# Patient Record
Sex: Female | Born: 1970 | Race: White | Hispanic: No | Marital: Married
Health system: Southern US, Community
[De-identification: ages and names within clinical notes are randomized; demographics above are authoritative.]

## PROBLEM LIST (undated history)

## (undated) ENCOUNTER — Emergency Department (HOSPITAL_COMMUNITY): Payer: 59 | Source: Home / Self Care

---

## 2000-12-15 ENCOUNTER — Other Ambulatory Visit: Admission: RE | Admit: 2000-12-15 | Discharge: 2000-12-15 | Payer: Self-pay | Admitting: Obstetrics & Gynecology

## 2001-04-30 ENCOUNTER — Inpatient Hospital Stay (HOSPITAL_COMMUNITY): Admission: AD | Admit: 2001-04-30 | Discharge: 2001-04-30 | Payer: Self-pay | Admitting: Obstetrics & Gynecology

## 2001-05-01 ENCOUNTER — Inpatient Hospital Stay (HOSPITAL_COMMUNITY): Admission: AD | Admit: 2001-05-01 | Discharge: 2001-05-01 | Payer: Self-pay | Admitting: Obstetrics and Gynecology

## 2001-05-02 ENCOUNTER — Encounter (INDEPENDENT_AMBULATORY_CARE_PROVIDER_SITE_OTHER): Payer: Self-pay

## 2001-05-02 ENCOUNTER — Inpatient Hospital Stay (HOSPITAL_COMMUNITY): Admission: AD | Admit: 2001-05-02 | Discharge: 2001-05-04 | Payer: Self-pay | Admitting: Obstetrics and Gynecology

## 2001-08-30 ENCOUNTER — Encounter (INDEPENDENT_AMBULATORY_CARE_PROVIDER_SITE_OTHER): Payer: Self-pay

## 2001-08-30 ENCOUNTER — Observation Stay (HOSPITAL_COMMUNITY): Admission: RE | Admit: 2001-08-30 | Discharge: 2001-08-31 | Payer: Self-pay | Admitting: Obstetrics & Gynecology

## 2002-04-19 ENCOUNTER — Other Ambulatory Visit: Admission: RE | Admit: 2002-04-19 | Discharge: 2002-04-19 | Payer: Self-pay | Admitting: Obstetrics & Gynecology

## 2004-01-30 ENCOUNTER — Other Ambulatory Visit: Admission: RE | Admit: 2004-01-30 | Discharge: 2004-01-30 | Payer: Self-pay | Admitting: Obstetrics & Gynecology

## 2005-07-23 ENCOUNTER — Ambulatory Visit: Payer: Self-pay | Admitting: Internal Medicine

## 2005-08-23 ENCOUNTER — Ambulatory Visit: Payer: Self-pay | Admitting: Internal Medicine

## 2005-09-02 ENCOUNTER — Ambulatory Visit: Payer: Self-pay | Admitting: Internal Medicine

## 2005-09-22 ENCOUNTER — Encounter: Admission: RE | Admit: 2005-09-22 | Discharge: 2005-09-22 | Payer: Self-pay

## 2005-10-11 ENCOUNTER — Encounter: Admission: RE | Admit: 2005-10-11 | Discharge: 2005-10-11 | Payer: Self-pay | Admitting: Orthopedic Surgery

## 2005-12-22 ENCOUNTER — Ambulatory Visit: Payer: Self-pay | Admitting: Internal Medicine

## 2006-09-04 ENCOUNTER — Encounter: Admission: RE | Admit: 2006-09-04 | Discharge: 2006-09-04 | Payer: Self-pay | Admitting: Family Medicine

## 2008-09-23 ENCOUNTER — Encounter: Payer: Self-pay | Admitting: Internal Medicine

## 2008-10-28 ENCOUNTER — Ambulatory Visit: Payer: Self-pay | Admitting: Internal Medicine

## 2008-10-28 DIAGNOSIS — M79609 Pain in unspecified limb: Secondary | ICD-10-CM | POA: Insufficient documentation

## 2008-10-28 DIAGNOSIS — E785 Hyperlipidemia, unspecified: Secondary | ICD-10-CM | POA: Insufficient documentation

## 2008-10-28 DIAGNOSIS — F3289 Other specified depressive episodes: Secondary | ICD-10-CM | POA: Insufficient documentation

## 2008-10-28 DIAGNOSIS — F329 Major depressive disorder, single episode, unspecified: Secondary | ICD-10-CM | POA: Insufficient documentation

## 2008-10-28 DIAGNOSIS — F411 Generalized anxiety disorder: Secondary | ICD-10-CM | POA: Insufficient documentation

## 2008-10-28 DIAGNOSIS — D649 Anemia, unspecified: Secondary | ICD-10-CM | POA: Insufficient documentation

## 2009-08-16 ENCOUNTER — Ambulatory Visit: Payer: Self-pay | Admitting: Diagnostic Radiology

## 2009-08-16 ENCOUNTER — Emergency Department (HOSPITAL_BASED_OUTPATIENT_CLINIC_OR_DEPARTMENT_OTHER): Admission: EM | Admit: 2009-08-16 | Discharge: 2009-08-16 | Payer: Self-pay | Admitting: Emergency Medicine

## 2009-09-24 ENCOUNTER — Encounter: Admission: RE | Admit: 2009-09-24 | Discharge: 2009-09-24 | Payer: Self-pay | Admitting: Sports Medicine

## 2010-04-29 ENCOUNTER — Ambulatory Visit: Payer: Self-pay | Admitting: Genetic Counselor
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
## 2010-05-05 NOTE — Letter (Signed)
Summary: Sprained ankle/[facility]  Sprained ankle/[facility]   Imported By: Lester  11/07/2008 08:24:14  _____________________________________________________________________  External Attachment:    Type:   Image     Comment:   External Document

## 2010-06-01 ENCOUNTER — Other Ambulatory Visit: Payer: Self-pay | Admitting: Dermatology

## 2010-06-15 ENCOUNTER — Encounter: Payer: Self-pay | Admitting: Internal Medicine

## 2010-06-15 ENCOUNTER — Encounter: Payer: 59 | Admitting: Genetic Counselor

## 2010-06-25 ENCOUNTER — Other Ambulatory Visit: Payer: Self-pay | Admitting: Dermatology

## 2010-07-02 NOTE — Letter (Signed)
Summary: [facility]  [facility]   Imported By: Sherian Rein 06/22/2010 12:32:25  _____________________________________________________________________  External Attachment:    Type:   Image     Comment:   External Document
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
## 2010-08-21 NOTE — Op Note (Signed)
[facility]  Patient:    Sylvia Phillips, Sylvia Phillips Visit Number: [id number] MRN: [id number]          Service Type: DSU Location: 910B 9198 01 Attending Physician:  Minette Headland Dictated by:   Freddy Finner, M.D. Proc. Date: 08/30/01 Admit Date:  08/30/2001                             Operative Report  PREOPERATIVE DIAGNOSIS:       Microinvasive carcinoma of the cervix.  POSTOPERATIVE DIAGNOSIS:      Microinvasive carcinoma of the cervix.  OPERATION/PROCEDURE:          Total vaginal hysterectomy.  SURGEON:                      Freddy Finner, M.D.  ASSISTANT:                    Trevor Iha, M.D.  ESTIMATED BLOOD LOSS:         Intraoperatively less than or equal to 50 cc.  COMPLICATIONS:                Intraoperative, none.  ANESTHESIA:                   General endotracheal.  PROCEDURE:                    The patient was admitted on the morning of surgery and given 1 g Cefotan IV preoperatively, placed in PAS hose, and taken to the operating room and there placed under adequate general endotracheal anesthesia and placed in the dorsal lithotomy position.  Betadine prep of the lower abdomen, perineum, and vagina was carried out in the usual fashion. Sterile drapes were applied.  A posterior weighted vaginal retractor was placed.  The cervix was grasped with a single-tooth tenaculum to avoid distorting the cervix and the transformation zone.  A posterior colpotomy incision was made while tenting the mucosa posterior to the cervix.  The cervix was circumscribed with a scalpel.  Initial attempts were made to use the LigaSure system but it was defective.  The uterosacrals were clamped with Heaneys and divided sharply and ligated with 0 Vicryl in Heaney fashion. Bladder pillars were taken separately and suture ligated with 0 Monocryl.  The anterior peritoneum was entered.  Cardinal ligament pedicles were taken and divided sharply and  ligated with suture ligatures of 0 Monocryl.  Vascular pedicles were taken curved Heaneys, divided sharply, and ligated with 0 Monocryl.  The uterus was delivered to the introitus.  Utero-ovarian pedicles were clamped with Heaneys and the uterus removed.  The cervix was labeled with a suture at the 12 oclock position and the specimen was taken to pathology in fresh condition.  The utero-ovarian pedicles were doubly ligated with three ties of 0 Monocryl followed by suture ligature of 0 Monocryl.  Hemostasis was complete at this level.  The uterosacrals were anchored to the vagina with mattress sutures of 0 Monocryl.  The uterosacrals were plicated with interrupted 0 Monocryl, which also closed the posterior peritoneum.  The cuff was closed vertically with figure-of-eights of 0 Monocryl.  A Foley catheter was inserted.  The patient was awakened and taken to the recovery room in good condition.Dictated by:   Freddy Finner, M.D. Attending Physician:  Minette Headland DD:  08/30/01 TD:  08/30/01 Job:  [id number]

## 2010-08-21 NOTE — Discharge Summary (Signed)
[facility]  Patient:    Sylvia Phillips, Sylvia Phillips Visit Number: [id number] MRN: [id number]          Service Type: DSU Location: 910A 9119 01 Attending Physician:  Minette Headland Dictated by:   Freddy Finner, M.D. 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  OPERATIVE PROCEDURE:             Total vaginal hysterectomy.  POSTOPERATIVE COMPLICATIONS:     Sinusitis; no other significant complications.  DISPOSITION:                     The patient is in satisfactory condition at the time of discharge.  She is to have progressive increasing physical activity with no vaginal entry, no heavy lifting.  She is to call for fever above 100 or heavy vaginal bleeding.  She is given the following medications.  DISCHARGE MEDICATIONS:           1. Ceftin 500 mg b.i.d. for a week.                                  2. Allegra-D b.i.d. for 3-7 days as needed.                                  3. Percocet 5 mg to be taken one or two every                                     4-6 hours as needed for postoperative                                     pain.                                  4. Phenergan suppositories 25 mg to be used                                     every 4-6 hours as needed for nausea and                                     vomiting.  DIET:                            Regular.  FOLLOWUP:  Follow up in 2 weeks.  HISTORY OF PRESENT ILLNESS:      Details of the present illness are recorded in the admission note.  The patient was admitted on the morning of surgery.  LABORATORY DATA:                 Laboratory data during this admission includes preoperative CBC which was normal except for a platelet count of 500,000; postoperative CBC showed a 7.8 white count,  10.1 hemoglobin, 423,000 platelets.  Admission prothrombin time/PTT and INR were essentially normal. The admission metabolic panel was normal.  HOSPITAL COURSE:                 The patient was admitted on the morning of surgery, brought to the operating room where the above-named surgery was accomplished.  Pathology reports pending at the time of discharge.  Over the 36 hours following surgery, the patient remained afebrile, she was ambulating without difficulty, having adequate bowel and bladder function.  She was complaining of sinus symptoms and had some emesis but she thinks this is related to sinus pressure and headache and has definitively requested discharge.  She is discharged home with thorough disposition as noted above. Dictated by:   Freddy Finner, M.D. Attending Physician:  Minette Headland DD:  08/31/01 TD:  09/02/01 Job: [id number]
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
## 2015-01-10 ENCOUNTER — Other Ambulatory Visit: Payer: Self-pay

## 2016-09-28 ENCOUNTER — Other Ambulatory Visit: Payer: Self-pay | Admitting: Family Medicine

## 2016-09-28 DIAGNOSIS — N63 Unspecified lump in unspecified breast: Secondary | ICD-10-CM

## 2016-10-01 ENCOUNTER — Other Ambulatory Visit: Payer: Self-pay

## 2016-10-07 ENCOUNTER — Other Ambulatory Visit: Payer: Self-pay

## 2016-10-13 ENCOUNTER — Other Ambulatory Visit: Payer: Self-pay

## 2016-10-18 ENCOUNTER — Other Ambulatory Visit: Payer: Self-pay

## 2020-08-08 ENCOUNTER — Inpatient Hospital Stay: Payer: 59

## 2020-08-08 ENCOUNTER — Other Ambulatory Visit: Payer: Self-pay

## 2020-08-08 ENCOUNTER — Inpatient Hospital Stay: Payer: 59 | Attending: Medical | Admitting: Genetic Counselor

## 2020-08-08 DIAGNOSIS — Z803 Family history of malignant neoplasm of breast: Secondary | ICD-10-CM | POA: Diagnosis not present

## 2020-08-08 DIAGNOSIS — Z8042 Family history of malignant neoplasm of prostate: Secondary | ICD-10-CM

## 2020-08-08 DIAGNOSIS — Z8041 Family history of malignant neoplasm of ovary: Secondary | ICD-10-CM

## 2020-08-08 DIAGNOSIS — Z8049 Family history of malignant neoplasm of other genital organs: Secondary | ICD-10-CM

## 2020-08-08 DIAGNOSIS — Z800 Family history of malignant neoplasm of digestive organs: Secondary | ICD-10-CM | POA: Diagnosis not present

## 2020-08-08 DIAGNOSIS — Z8481 Family history of carrier of genetic disease: Secondary | ICD-10-CM

## 2020-08-15 ENCOUNTER — Encounter: Payer: Self-pay | Admitting: Genetic Counselor

## 2020-08-15 DIAGNOSIS — Z8481 Family history of carrier of genetic disease: Secondary | ICD-10-CM | POA: Insufficient documentation

## 2020-08-15 DIAGNOSIS — Z8042 Family history of malignant neoplasm of prostate: Secondary | ICD-10-CM

## 2020-08-15 DIAGNOSIS — Z8041 Family history of malignant neoplasm of ovary: Secondary | ICD-10-CM

## 2020-08-15 DIAGNOSIS — Z800 Family history of malignant neoplasm of digestive organs: Secondary | ICD-10-CM

## 2020-08-15 DIAGNOSIS — Z803 Family history of malignant neoplasm of breast: Secondary | ICD-10-CM

## 2020-08-15 DIAGNOSIS — Z8049 Family history of malignant neoplasm of other genital organs: Secondary | ICD-10-CM | POA: Insufficient documentation

## 2020-08-15 HISTORY — DX: Family history of malignant neoplasm of breast: Z80.3

## 2020-08-15 HISTORY — DX: Family history of malignant neoplasm of prostate: Z80.42

## 2020-08-15 HISTORY — DX: Family history of malignant neoplasm of ovary: Z80.41

## 2020-08-15 HISTORY — DX: Family history of malignant neoplasm of digestive organs: Z80.0

## 2020-08-15 HISTORY — DX: Family history of malignant neoplasm of other genital organs: Z80.49

## 2020-08-15 NOTE — Progress Notes (Signed)
REFERRING PROVIDER: No referring provider defined for this encounter.  PRIMARY PROVIDER:  No PCP  PRIMARY REASON FOR VISIT:  1. Family history of breast cancer   2. Family history of colon cancer   3. Family history of ovarian cancer   4. Family history of pancreatic cancer   5. Family history of prostate cancer   6. Family history of uterine cancer   7. Family history of BAP1 mutation    HISTORY OF PRESENT ILLNESS:   Sylvia Phillips, a 50 y.o. female, was seen for a [place] cancer genetics consultation due to a family history of cancer and a family history of a hereditary cancer mutation.  Sylvia Phillips presents to clinic today to discuss the possibility of a hereditary predisposition to cancer, to discuss genetic testing, and to further clarify her future cancer risks, as well as potential cancer risks for family members.   Sylvia Phillips has a personal history of cervical cancer in her 54s.  CANCER HISTORY:  Oncology History   No history exists.   RISK FACTORS:  Menarche was at age 23.  First live birth at age 72.  Ovaries intact: yes.  HRT use: 0 years. Colonoscopy: no; not examined. Mammogram within the last year: no.  Number of breast biopsies: 0. Up to date with pelvic exams: no.   Past Medical History:  Diagnosis Date  . Family history of breast cancer 08/15/2020  . Family history of colon cancer 08/15/2020  . Family history of ovarian cancer 08/15/2020  . Family history of pancreatic cancer 08/15/2020  . Family history of prostate cancer 08/15/2020  . Family history of uterine cancer 08/15/2020    Social History   Socioeconomic History  . Marital status: Married    Spouse name: Not on file  . Number of children: Not on file  . Years of education: Not on file  . Highest education level: Not on file  Occupational History  . Not on file  Tobacco Use  . Smoking status: Not on file  . Smokeless tobacco: Not on file  Substance and Sexual Activity  . Alcohol use: Not on  file  . Drug use: Not on file  . Sexual activity: Not on file  Other Topics Concern  . Not on file  Social History Narrative  . Not on file   Social Determinants of Health   Financial Resource Strain: Not on file  Food Insecurity: Not on file  Transportation Needs: Not on file  Physical Activity: Not on file  Stress: Not on file  Social Connections: Not on file     FAMILY HISTORY:  We obtained a detailed, 4-generation family history.  Significant diagnoses are listed below: Family History  Problem Relation Age of Onset  . Breast cancer Mother        contralateral; dx 43; dx 53  . Other Mother        BAP1 mutation  . Prostate cancer Father        dx before 40  . Breast cancer Paternal Aunt 38  . Colon cancer Maternal Grandmother 51  . Cancer Maternal Grandfather        unknown type; dx 90s  . Kidney cancer Paternal Grandfather   . Ovarian cancer Other 65       MGF's sisters, x2  . Pancreatic cancer Other 54       MGF's sister  . Colon cancer Other 57       MGM's brother  . Uterine cancer Other  7       MGM's sister     Sylvia Phillips has two sisters and two daughters.   Sylvia Phillips mother had contralateral breast cancer diagnosed at age 65 and 67. Sylvia Phillips's mother had genetic testing of a 61 gene panel through [employer] [employer] in 2022, and was positive for a mutation in BAP1 called [id number] ([id number]).  Sylvia Phillips maternal aunt had contralateral breast cancer diagnosed at ages 37 and 70. Sylvia Phillips's maternal aunt had panel genetic testing with no pathogenic variants detected.  Sylvia Phillips maternal grandmother had colon cancer at age 49.  Sylvia Phillips's maternal grandfather had an unknown type of cancer diagnosed his 59s.  There is a family history of ovarian and pancreatic cancer in her maternal grandfather's sisters.  There is a family history of colon and uterine cancer in her maternal grandmother's siblings.    Sylvia Phillips father died at age 37 and had  prostate cancer before the age of 64.  Sylvia Phillips's paternal aunt had breast cancer at age 67.  Her paternal grandfather had kidney cancer diagnosed at age 61.    Sylvia Phillips is unaware of previous family history of genetic testing for hereditary cancer risks besides that mentioned above. There is no reported Ashkenazi Jewish ancestry. There is no known consanguinity.   GENETIC COUNSELING ASSESSMENT: Sylvia Phillips is a 50y.o. female with a paternal family history of cancer which is somewhat suggestive of a hereditary cancer syndrome and a maternal family of a known hereditary predisposition to cancer. We, therefore, discussed and recommended the following at today's visit.    DISCUSSION: We discussed that 5 - 10% of cancer is hereditary, with most cases of hereditary breast and prostate cancer associated with mutations in BRCA1/2.  There are other genes that can be associated with hereditary breast and prostate cancer syndromes.  Type of cancer risk and level of risk are gene-specific.  We discussed that given her mother has a mutation in the BAP1 gene, Sylvia Phillips has a 50% chance of having the same mutation in BAP1.  We reviewed the cancer risks associated with BAP1 mutations including renal cell carcinoma, melanoma, other skin cancers, and mesothelioma.  We also discussed management strategies and familial implications associated with a positive result in BAP1.   We discussed that testing is beneficial for several reasons, including knowing about other cancer risks, identifying potential screening and risk-reduction options that may be appropriate, and to understanding if other family members could be at risk for cancer and allowing them to undergo genetic testing.   We reviewed the characteristics, features and inheritance patterns of hereditary cancer syndromes. We also discussed genetic testing, including the appropriate family members to test, the process of testing, insurance coverage and turn-around-time  for results. We discussed the implications of a negative, positive, carrier and/or variant of uncertain significant result. We recommended Sylvia Phillips pursue genetic testing for a panel that contains genes associated with breast and prostate cancer as well as the BAP1 gene.   Based on Sylvia Phillips's paternal family history of breast and prostate cancer and family history of a BAP1 mutation, she meets medical criteria for genetic testing. Despite that she meets criteria, she may still have an out of pocket cost.    We discussed that some people do not want to undergo genetic testing due to fear of genetic discrimination.  A federal law called the Genetic Information Non-Discrimination Act (GINA) of 2008 helps protect individuals against genetic discrimination based on their genetic  test results.  It impacts both health insurance and employment.  With health insurance, it protects against increased premiums, being kicked off insurance or being forced to take a test in order to be insured.  For employment it protects against hiring, firing and promoting decisions based on genetic test results.  GINA does not apply to those in the [employer], those who work for companies with less than 15 employees, and new life insurance or long-term disability insurance policies.  Health status due to a cancer diagnosis is not protected under GINA.   Based on the patient's family history, a statistical model [employer]) was used to estimate her risk of developing breast cancer. This estimates her lifetime risk of developing breast cancer to be approximately 22%. This estimation does not consider any personal genetic testing results.  The patient's lifetime breast cancer risk is a preliminary estimate based on available information using one of several models endorsed by the [place] (ACS). The ACS recommends consideration of breast MRI screening as an adjunct to mammography for patients at high risk (defined as 20%  or greater lifetime risk).   Sylvia Phillips has been determined to be at high risk for breast cancer.  Therefore, we recommend that annual screening with mammography and breast MRI be performed. We discussed that Ms. Chaput should discuss her individual situation with a primary care physician and determine a breast cancer screening plan with which they are both comfortable.  She declined a referral to our high-risk breast cancer clinic at this time.    PLAN:  Ms. Purpura did not wish to pursue genetic testing at today's visit due to GINA considerations. We understand this decision and remain available to coordinate genetic testing at any time in the future. We, therefore, recommend Ms. Capers continue to follow the cancer screening guidelines given by her primary healthcare provider and speak with her primary care provider about annual breast MRIs given her Tyrer Cuzick score.    Lastly, we encouraged Ms. Matin to remain in contact with cancer genetics annually so that we can continuously update the family history and inform her of any changes in cancer genetics and testing that may be of benefit for this family.    Ms. Cegielski questions were answered to her satisfaction today. Our contact information was provided should additional questions or concerns arise. Thank you for the referral and allowing [place] to share in the care of your patient.    Trea Carnegie M. Joette Catching, [place], [facility] Genetic Counselor [email or web address] (P) [phone]    The patient was seen for a total of 40 minutes in face-to-face genetic counseling.  Drs. Magrinat, Lindi Adie and/or Burr Medico were available to discuss this case as needed.  _______________________________________________________________________ For Office Staff:  Number of people involved in session: 3 Was an Intern/ student involved with case: no
